# Patient Record
Sex: Male | Born: 1990 | Race: White | Hispanic: No | Marital: Single | Smoking: Never smoker
Health system: Southern US, Community
[De-identification: ages and names within clinical notes are randomized; demographics above are authoritative.]

## PROBLEM LIST (undated history)

## (undated) DIAGNOSIS — IMO0002 Reserved for concepts with insufficient information to code with codable children: Secondary | ICD-10-CM

---
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
## 2010-05-25 ENCOUNTER — Emergency Department: Payer: Self-pay | Admitting: Emergency Medicine

## 2010-05-27 ENCOUNTER — Emergency Department: Payer: Self-pay | Admitting: Internal Medicine

## 2012-03-28 ENCOUNTER — Emergency Department (HOSPITAL_COMMUNITY)
Admission: EM | Admit: 2012-03-28 | Discharge: 2012-03-28 | Disposition: A | Discharge status: Home / Self Care | Payer: Self-pay | Attending: Emergency Medicine | Admitting: Emergency Medicine

## 2012-03-28 ENCOUNTER — Encounter (HOSPITAL_COMMUNITY): Payer: Self-pay | Admitting: Emergency Medicine

## 2012-03-28 DIAGNOSIS — IMO0002 Reserved for concepts with insufficient information to code with codable children: Secondary | ICD-10-CM | POA: Insufficient documentation

## 2012-03-28 DIAGNOSIS — M545 Low back pain, unspecified: Secondary | ICD-10-CM | POA: Insufficient documentation

## 2012-03-28 DIAGNOSIS — X58XXXA Exposure to other specified factors, initial encounter: Secondary | ICD-10-CM | POA: Insufficient documentation

## 2012-03-28 DIAGNOSIS — M549 Dorsalgia, unspecified: Secondary | ICD-10-CM

## 2012-03-28 DIAGNOSIS — G8929 Other chronic pain: Secondary | ICD-10-CM | POA: Insufficient documentation

## 2012-03-28 DIAGNOSIS — T148XXA Other injury of unspecified body region, initial encounter: Secondary | ICD-10-CM | POA: Insufficient documentation

## 2012-03-28 HISTORY — DX: Reserved for concepts with insufficient information to code with codable children: IMO0002

## 2012-03-28 MED ORDER — HYDROMORPHONE HCL PF 1 MG/ML IJ SOLN
1.0000 mg | Freq: Once | INTRAMUSCULAR | Status: AC
  Administered 2012-03-28: 1 mg via INTRAMUSCULAR
  Filled 2012-03-28: qty 1

## 2012-03-28 MED ORDER — OXYCODONE-ACETAMINOPHEN 10-325 MG PO TABS: 1.0000 | ORAL_TABLET | ORAL | Status: AC | PRN

## 2012-03-28 MED ORDER — METHOCARBAMOL 500 MG PO TABS: 500.0000 mg | ORAL_TABLET | Freq: Two times a day (BID) | ORAL | Status: AC

## 2012-03-28 MED ORDER — DIAZEPAM 5 MG/ML IJ SOLN
5.0000 mg | Freq: Once | INTRAMUSCULAR | Status: AC
  Administered 2012-03-28: 5 mg via INTRAMUSCULAR
  Filled 2012-03-28: qty 2

## 2012-03-28 NOTE — Discharge Instructions (Signed)
Please followup with her primary care provider or orthopedic specialist for continued evaluation and management of your chronic low back pains.   Chronic Back Pain When back pain lasts longer than 3 months, it is called chronic back pain.This pain can be frustrating, but the cause of the pain is rarely dangerous.People with chronic back pain often go through certain periods that are more intense (flare-ups). CAUSES Chronic back pain can be caused by wear and tear (degeneration) on different structures in your back. These structures may include bones, ligaments, or discs. This degeneration may result in more pressure being placed on the nerves that travel to your legs and feet. This can lead to pain traveling from the low back down the back of the legs. When pain lasts longer than 3 months, it is not unusual for people to experience anxiety or depression. Anxiety and depression can also contribute to low back pain. TREATMENT  Establish a regular exercise plan. This is critical to improving your functional level.   Have a self-management plan for when you flare-up. Flare-ups rarely require a medical visit. Regular exercise will help reduce the intensity and frequency of your flare-ups.   Manage how you feel about your back pain and the rest of your life. Anxiety, depression, and feeling that you cannot alter your back pain have been shown to make back pain more intense and debilitating.   Medicines should never be your only treatment. They should be used along with other treatments to help you return to a more active lifestyle.   Procedures such as injections or surgery may be helpful but are rarely necessary. You may be able to get the same results with physical therapy or chiropractic care.  HOME CARE INSTRUCTIONS  Avoid bending, heavy lifting, prolonged sitting, and activities which make the problem worse.   Continue normal activity as much as possible.   Take brief periods of rest  throughout the day to reduce your pain during flare-ups.   Follow your back exercise rehabilitation program. This can help reduce symptoms and prevent more pain.   Only take over-the-counter or prescription medicines as directed by your caregiver. Muscle relaxants are sometimes prescribed. Narcotic pain medicine is discouraged for long-term pain, since addiction is a possible outcome.   If you smoke, quit.   Eat healthy foods and maintain a recommended body weight.  SEEK IMMEDIATE MEDICAL CARE IF:   You have weakness or numbness in one of your legs or feet.   You have trouble controlling your bladder or bowels.   You develop nausea, vomiting, abdominal pain, shortness of breath, or fainting.  Document Released: 12/28/2004 Document Revised: 11/09/2011 Document Reviewed: 11/04/2011 [facility] Patient Information 2012 [place], [place].    Back Exercises Back exercises help treat and prevent back injuries. The goal of back exercises is to increase the strength of your abdominal and back muscles and the flexibility of your back. These exercises should be started when you no longer have back pain. Back exercises include:  Pelvic Tilt. Lie on your back with your knees bent. Tilt your pelvis until the lower part of your back is against the floor. Hold this position 5 to 10 sec and repeat 5 to 10 times.   Knee to Chest. Pull first 1 knee up against your chest and hold for 20 to 30 seconds, repeat this with the other knee, and then both knees. This may be done with the other leg straight or bent, whichever feels better.   Sit-Ups or Curl-Ups. [employer]  your knees 90 degrees. Start with tilting your pelvis, and do a partial, slow sit-up, lifting your trunk only 30 to 45 degrees off the floor. Take at least 2 to 3 seconds for each sit-up. Do not do sit-ups with your knees out straight. If partial sit-ups are difficult, simply do the above but with only tightening your abdominal muscles and holding it as  directed.   Hip-Lift. Lie on your back with your knees flexed 90 degrees. Push down with your feet and shoulders as you raise your hips a couple inches off the floor; hold for 10 seconds, repeat 5 to 10 times.   Back arches. Lie on your stomach, propping yourself up on bent elbows. Slowly press on your hands, causing an arch in your low back. Repeat 3 to 5 times. Any initial stiffness and discomfort should lessen with repetition over time.   Shoulder-Lifts. Lie face down with arms beside your body. Keep hips and torso pressed to floor as you slowly lift your head and shoulders off the floor.  Do not overdo your exercises, especially in the beginning. Exercises may cause you some mild back discomfort which lasts for a few minutes; however, if the pain is more severe, or lasts for more than 15 minutes, do not continue exercises until you see your caregiver. Improvement with exercise therapy for back problems is slow.  See your caregivers for assistance with developing a proper back exercise program. Document Released: 12/28/2004 Document Revised: 11/09/2011 Document Reviewed: 11/20/2005 [facility] Patient Information 2012 [place], [place].     Chronic Pain Management Managing chronic pain is not easy. The goal is to provide as much pain relief as possible. There are emotional as well as physical problems. Chronic pain may lead to symptoms of depression which magnify those of the pain. Problems may include:  Anxiety.   Sleep disturbances.   Confused thinking.   Feeling cranky.   Fatigue.   Weight gain or loss.  Identify the source of the pain first, if possible. The pain may be masking another problem. Try to find a pain management specialist or clinic. Work with a team to create a treatment plan for you. MEDICATIONS  May include narcotics or opioids. Larger than normal doses may be needed to control your pain.   Drugs for depression may help.   Over-the-counter medicines may help for  some conditions. These drugs may be used along with others for better pain relief.   May be injected into sites such as the spine and joints. Injections may have to be repeated if they wear off.  THERAPY MAY INCLUDE:  Working with a physical therapist to keep from getting stiff.   Regular, gentle exercise.   Cognitive or behavioral therapy.   Using complementary or integrative medicine such as:   Acupuncture.   Massage, Reiki, or Rolfing.   Aroma, color, light, or sound therapy.   Group support.  FOR MORE INFORMATION [email or web address]. American Chronic Pain Association [email or web address]. Document Released: 12/28/2004 Document Revised: 11/09/2011 Document Reviewed: 02/06/2008 [facility] Patient Information 2012 [place], [place].   RESOURCE GUIDE  Dental Problems  Patients with Medicaid: [facility] [phone] [address]  [address] Phone:  [phone]                                                  Phone:  [phone]  If unable to pay or uninsured, contact:  [facility] or [facility]. to become qualified for the adult dental clinic.  Chronic Pain Problems Contact [facility]  [phone] Patients need to be referred by their primary care doctor.  Insufficient Money for Medicine Contact [employer] Way:  call "211" or [facility] [phone].  No Primary Care Doctor Call [facility]  [phone] Other agencies that provide inexpensive medical care    [facility]  9187639527    [facility]  [phone]    [facility]  [phone]    [facility]  [phone]    [facility]  [phone]    [facility]  [phone]  Psychological Services [facility] Behavioral Health  [phone] [facility]  [phone] [facility] Mental Health   [phone] (emergency services  ([phone])  Substance Abuse Resources [facility]  (416)576-4390 [facility] (682) 090-8524 [facility] 404-078-4159 Floydene Flock (910)277-7024 Residential & Outpatient Substance Abuse Program  (816)312-6610  Abuse/Neglect [facility] Child Abuse Hotline (506) 571-9898 [facility] Child Abuse Hotline (902) 566-5381 (After Hours)  Emergency Shelter [facility] 314-683-3519  Maternity Homes [facility] [phone] [facility] [phone]  MRSA Hotline #:   (734)082-3383    [facility] Resources  [facility]     [employer]                          [facility] Dept. [address]                        [address]      [address]  Blondell Reveal Phone:  [phone]                                   Phone:  [phone]                 Phone:  [phone]  [facility] Mental [facility] Phone:  [phone]  [facility] Child Abuse Hotline (307)799-5733 [phone] (After Hours)

## 2012-03-28 NOTE — ED Notes (Addendum)
Pt has chronic back pain; was working today and had difficulty standing back up. Dx degenerative disc disease. PT reports he was going to pain clinic thru [facility]. He no longer sees them due to fact that medicine they were prescribing were not helping.

## 2012-03-28 NOTE — ED Provider Notes (Signed)
History     CSN: [id number]  Arrival date & time 03/28/12  2201   First MD Initiated Contact with Patient 03/28/12 2217      Chief Complaint  Patient presents with  . Back Pain    HPI  History provided by the patient. Patient is a 21 year old male with reported history of degenerative disc disease and chronic back pains who presents with increased low back pain today. Patient states that he has had been low back pain discomfort for quite some time. Patient has been seen by trying orthopedics in [place] in the past. He states that he had MRI study showing decreased discs with some bulging. Since that time patient has not been following up regularly and is not on any medications. 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 Patient states that as he was finishing he's had sudden sharp pains in low back is worse than his typical daily pains he is worse with any movement and walking. Pain is slightly better with rest. Patient denies any other aggravating or alleviating factors. Pain does not radiate. Patient denies any urinary or fecal incontinence, urinary retention perineal numbness. Patient denies any weakness in lower legs. Patient has been ambulatory.    Past Medical History  Diagnosis Date  . Disc degeneration     No past surgical history on file.  No family history on file.  History  Substance Use Topics  . Smoking status: Not on file  . Smokeless tobacco: Not on file  . Alcohol Use:       Review of Systems  Constitutional: Negative for fever and chills.  Gastrointestinal: Negative for abdominal pain.  Genitourinary: Negative for dysuria, frequency, hematuria and flank pain.  Neurological: Negative for weakness and numbness.    Allergies  Review of patient's allergies indicates no known allergies.  Home Medications  No current outpatient prescriptions on file.  BP 148/86  Pulse 114  Temp(Src) 98.6 F (37 C) (Oral)  Resp 18  SpO2 100%  Physical  Exam  Nursing note and vitals reviewed. Constitutional: He is oriented to person, place, and time. He appears well-developed and well-nourished. No distress.  HENT:  Head: Normocephalic and atraumatic.  Cardiovascular: Normal rate and regular rhythm.   Pulmonary/Chest: Effort normal and breath sounds normal. No respiratory distress. He has no wheezes. He has no rales.  Abdominal: Soft. There is no tenderness.       No CVA tenderness  Musculoskeletal:       Cervical back: Normal.       Thoracic back: Normal.       Lumbar back: He exhibits tenderness.       Back:  Neurological: He is alert and oriented to person, place, and time. He has normal strength. No sensory deficit. Gait normal.  Skin: Skin is warm. No rash noted.  Psychiatric: He has a normal mood and affect. His behavior is normal.    ED Course  Procedures    1. Chronic back pain   2. Muscle strain       MDM  Patient seen and evaluated. Patient no acute distress. Patient with no replaced or back pain. No new injuries or trauma. Pain exacerbated by weed eating       Angus Seller, [place] 03/29/12 [phone]

## 2012-03-29 NOTE — ED Provider Notes (Signed)
Medical screening examination/treatment/procedure(s) were performed by non-physician practitioner and as supervising physician I was immediately available for consultation/collaboration.   Glynn Octave, MD 03/29/12 1021

## 2012-08-15 ENCOUNTER — Ambulatory Visit: Payer: Self-pay | Admitting: Family Medicine

## 2013-06-05 ENCOUNTER — Ambulatory Visit: Payer: Self-pay | Admitting: Family Medicine

## 2013-06-05 LAB — CBC WITH DIFFERENTIAL/PLATELET
Basophil %: 0.5 %
Eosinophil %: 2.9 %
Lymphocyte %: 19.3 %
MCHC: 34.1 g/dL (ref 32.0–36.0)
Monocyte #: 0.8 x10 3/mm (ref 0.2–1.0)
Neutrophil #: 5.1 10*3/uL (ref 1.4–6.5)
Neutrophil %: 67.4 %
RBC: 4.96 10*6/uL (ref 4.40–5.90)
RDW: 13.3 % (ref 11.5–14.5)
WBC: 7.6 10*3/uL (ref 3.8–10.6)

## 2013-10-13 ENCOUNTER — Ambulatory Visit: Payer: Self-pay | Admitting: Family Medicine

## 2013-10-15 ENCOUNTER — Emergency Department: Payer: Self-pay | Admitting: Emergency Medicine

## 2013-10-15 LAB — BASIC METABOLIC PANEL
Anion Gap: 3 — ABNORMAL LOW (ref 7–16)
BUN: 6 mg/dL — ABNORMAL LOW (ref 7–18)
Calcium, Total: 9.1 mg/dL (ref 8.5–10.1)
Chloride: 105 mmol/L (ref 98–107)
Co2: 29 mmol/L (ref 21–32)
EGFR (African American): >60
EGFR (Non-African Amer.): >60
Potassium: 4.4 mmol/L (ref 3.5–5.1)
Sodium: 137 mmol/L (ref 136–145)

## 2013-10-15 LAB — CBC
HGB: 16.2 g/dL (ref 13.0–18.0)
MCHC: 34.9 g/dL (ref 32.0–36.0)
MCV: 92 fL (ref 80–100)
RDW: 13.7 % (ref 11.5–14.5)

## 2014-04-21 IMAGING — CT CT HEAD WITHOUT AND WITH CONTRAST
1 of 2 series · 13 of 30 positions shown, 17 images · non-contrast
Comparison: none

REASON FOR EXAM: Call Report [id number] extreme headache  vomiting
COMMENTS:

PROCEDURE:     RONLOR - BLAIN JUMPER/PUSHPA  - August 15, 2012  [DATE]
RESULT:     Comparison:  None
INDICATION: Headache and vomiting.
TECHNIQUE: Multiple axial images were obtained prior to following 50 mL of
Zsovue-NPP IV contrast.

[Series 2: soft tissue wo · axial · 0.41mm/px · z∈[+14,+149]mm · 13 of 33 slices shown, 17 images]
[im 3/33  brain]
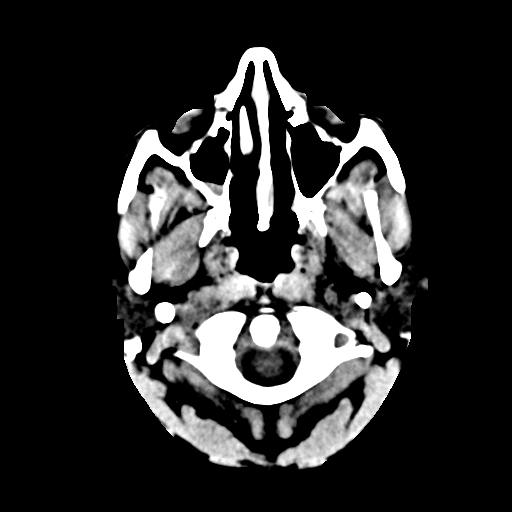
[im 3/33  bone]
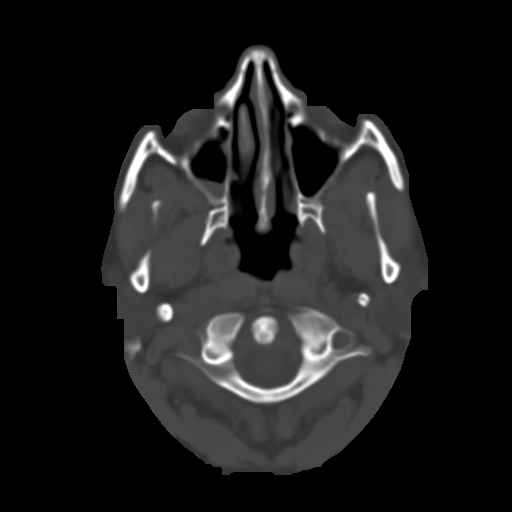
[im 5/33  brain]
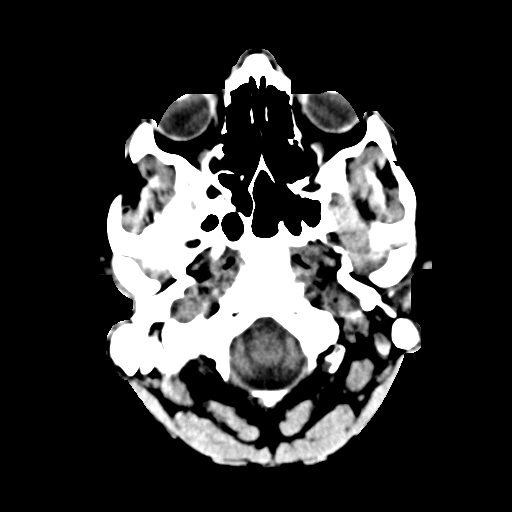
[im 7/33  brain]
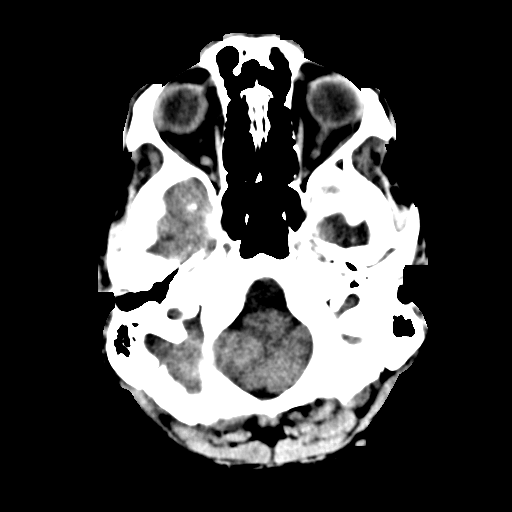
[im 10/33  brain]
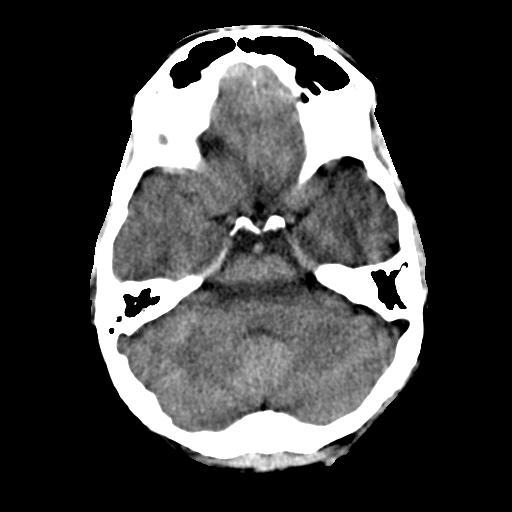
[im 12/33  brain]
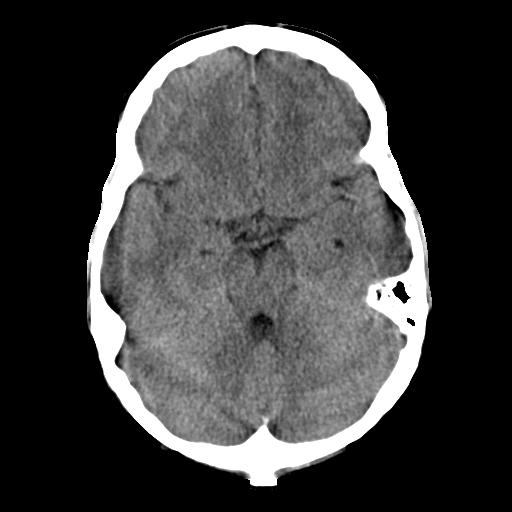
[im 12/33  bone]
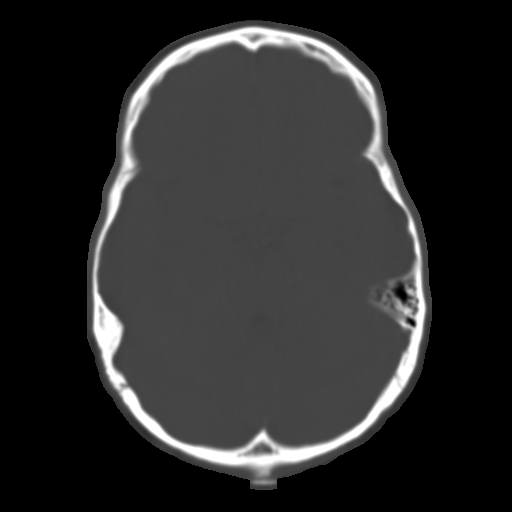
[im 14/33  brain]
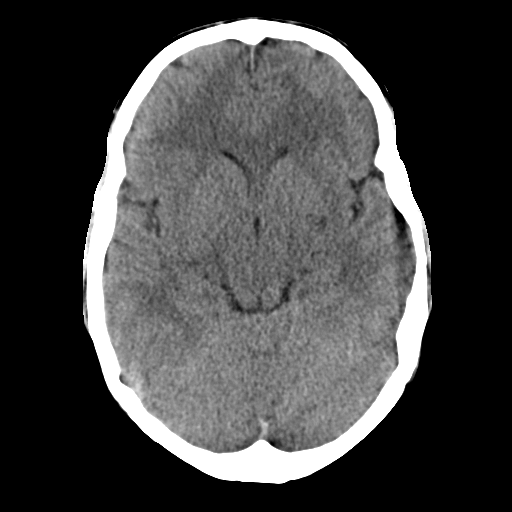
[im 17/33  brain]
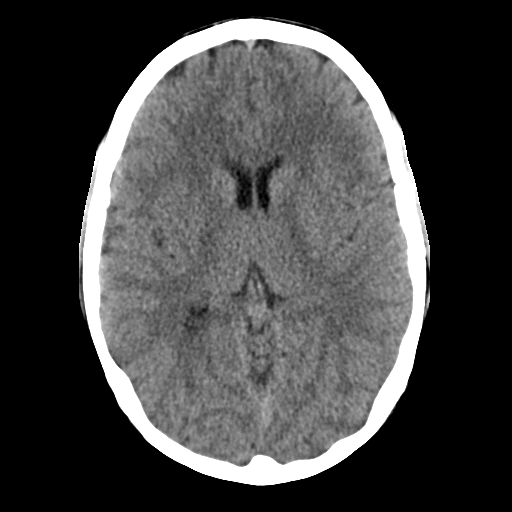
[im 19/33  brain]
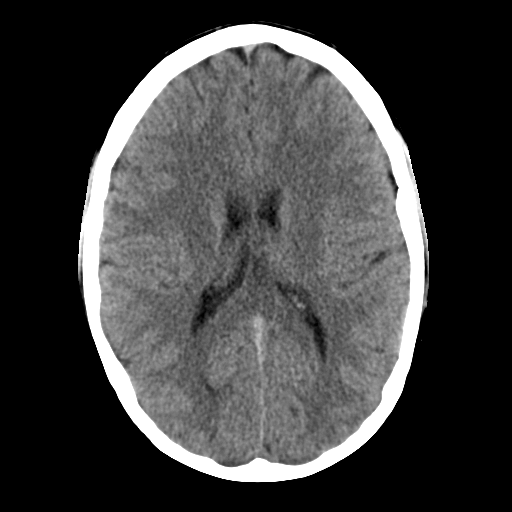
[im 21/33  brain]
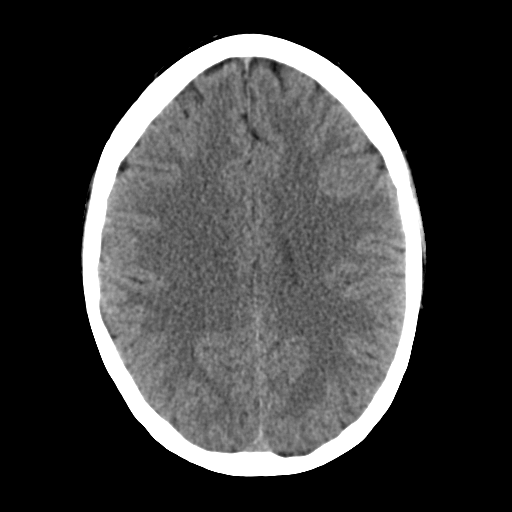
[im 21/33  bone]
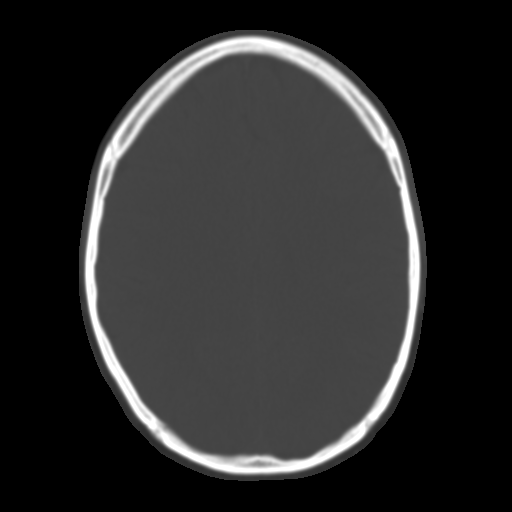
[im 23/33  brain]
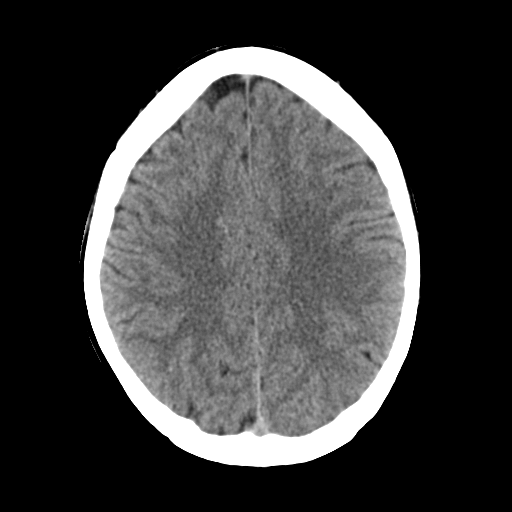
[im 26/33  brain]
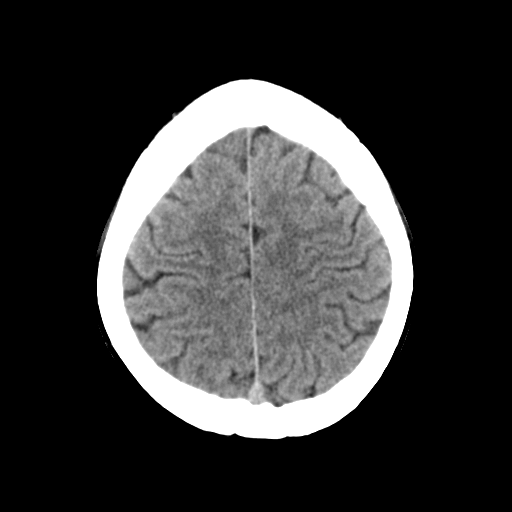
[im 28/33  brain]
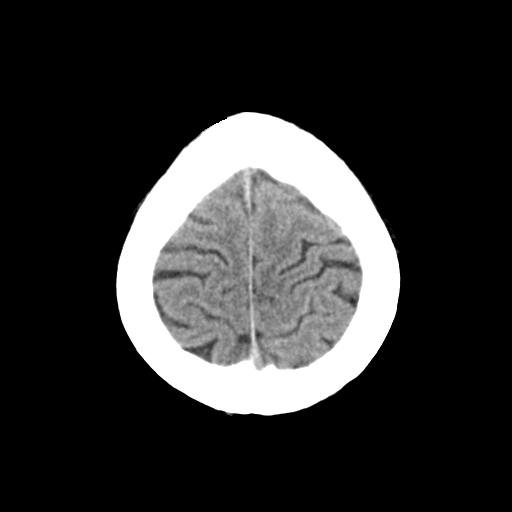
[im 30/33  brain]
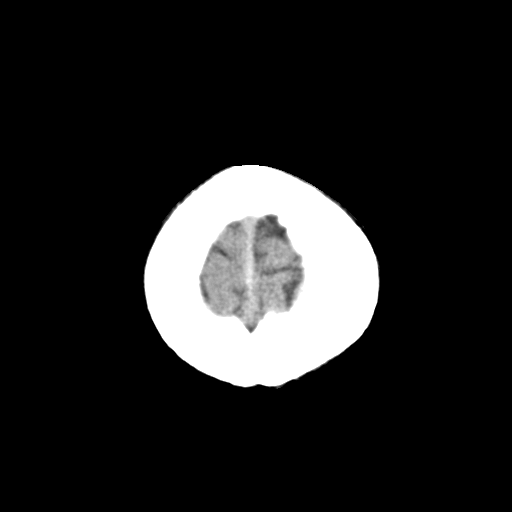
[im 30/33  bone]
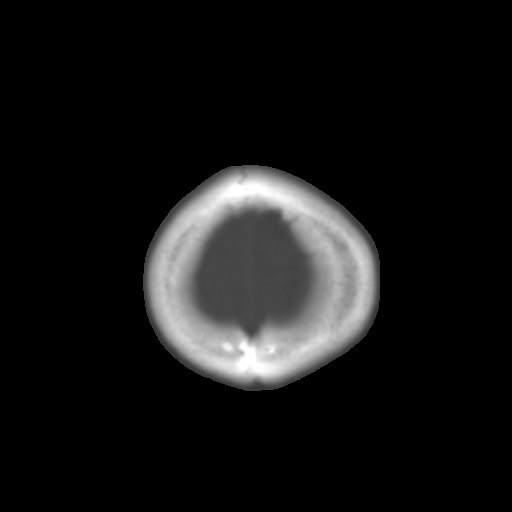

[13 of 30 positions shown; findings below may reference images not displayed]

FINDINGS: There is no abnormal parenchymal, leptomeningeal, or subependymal
enhancement. The gray-white interface is maintained. There is no evidence
for mass effect, midline shift, or extra-axial fluid collections.  The basal
cisterns are patent. There is no evidence for cortical-based area of
infarction.  Ventricles and sulci are appropriate for the patient's age.

Visualized portions of the orbits and paranasal sinuses are unremarkable.
Osseous structures are negative for fracture, lytic, or blastic lesions.
IMPRESSION: 1. No acute intracranial process.

2. No abnormal areas of enhancement.

[REDACTED]

## 2014-09-25 ENCOUNTER — Emergency Department: Payer: Self-pay | Admitting: Emergency Medicine

## 2015-06-19 IMAGING — CR DG CHEST 2V
1 series · 2 of 2 positions shown · non-contrast
Comparison: None.

CLINICAL DATA: Dry cough and congestion. Fever for 2 days.

EXAM:
CHEST  2 VIEW

[Series 1: pa · 0.17mm/px · 2 of 2 slices shown]
[im 1/2]
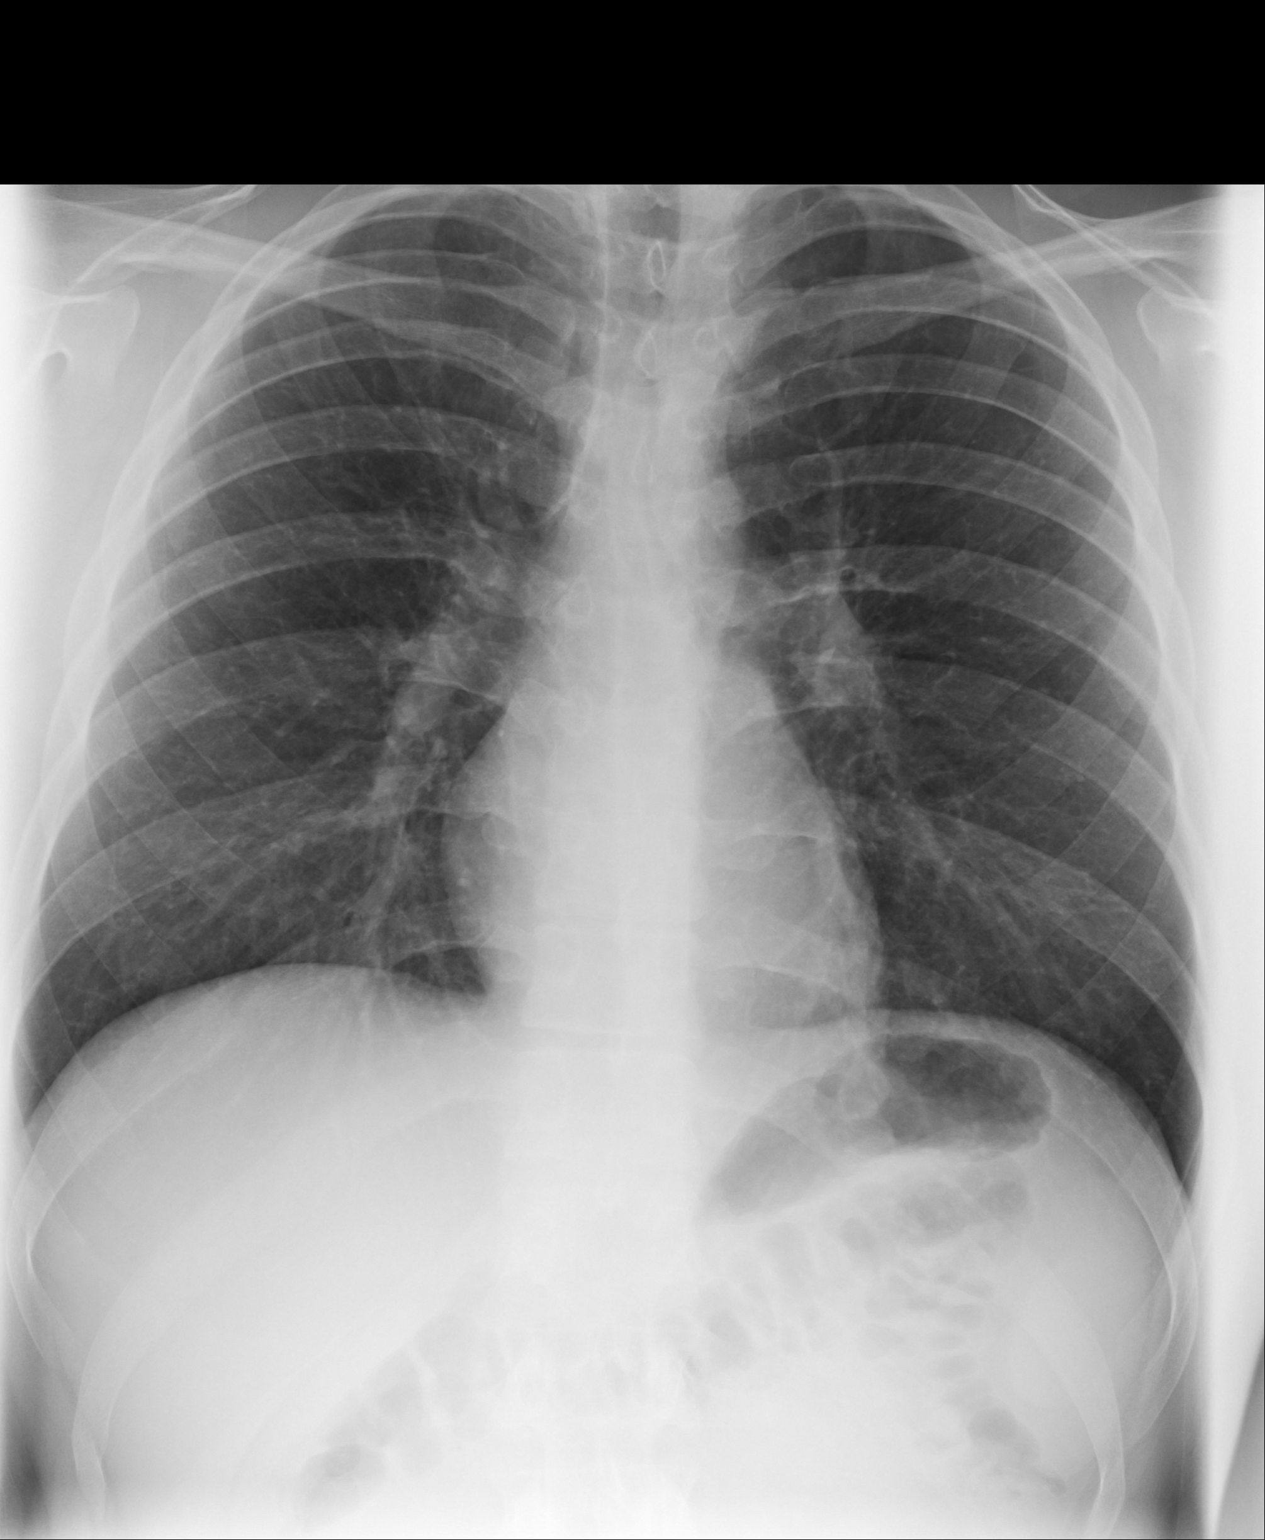
[im 2/2]
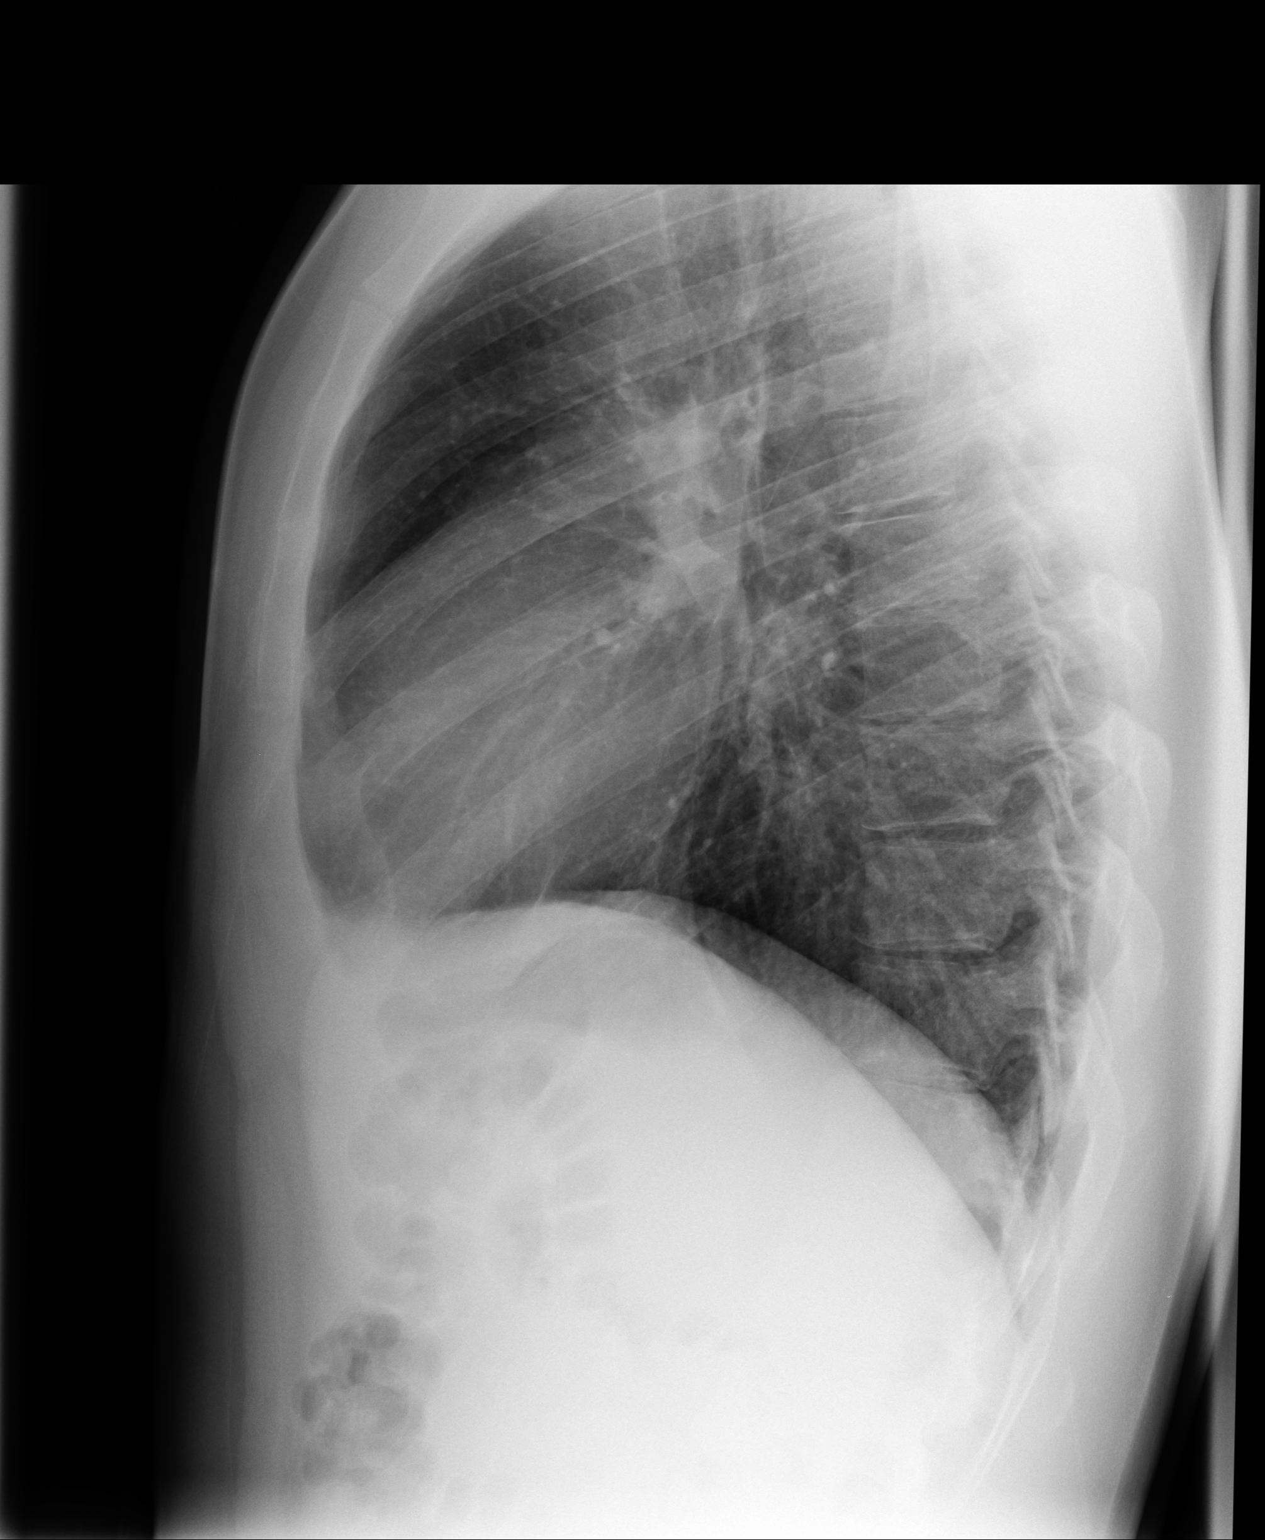

[2 of 2 positions shown; findings below may reference images not displayed]

FINDINGS: A mild pectus excavatum deformity. Midline trachea. Normal heart
size and mediastinal contours. No pleural effusion or pneumothorax.
Clear lungs.
IMPRESSION: No acute cardiopulmonary disease.

## 2015-06-21 IMAGING — CR DG CHEST 2V
1 series · 2 of 2 positions shown · non-contrast
Comparison: 10/13/2013.

CLINICAL DATA: Shortness of breath.  Smoker.  Possible pneumonia.

EXAM:
CHEST  2 VIEW

[Series 1: w chest pa · 0.14mm/px · 2 of 2 slices shown]
[im 1/2]
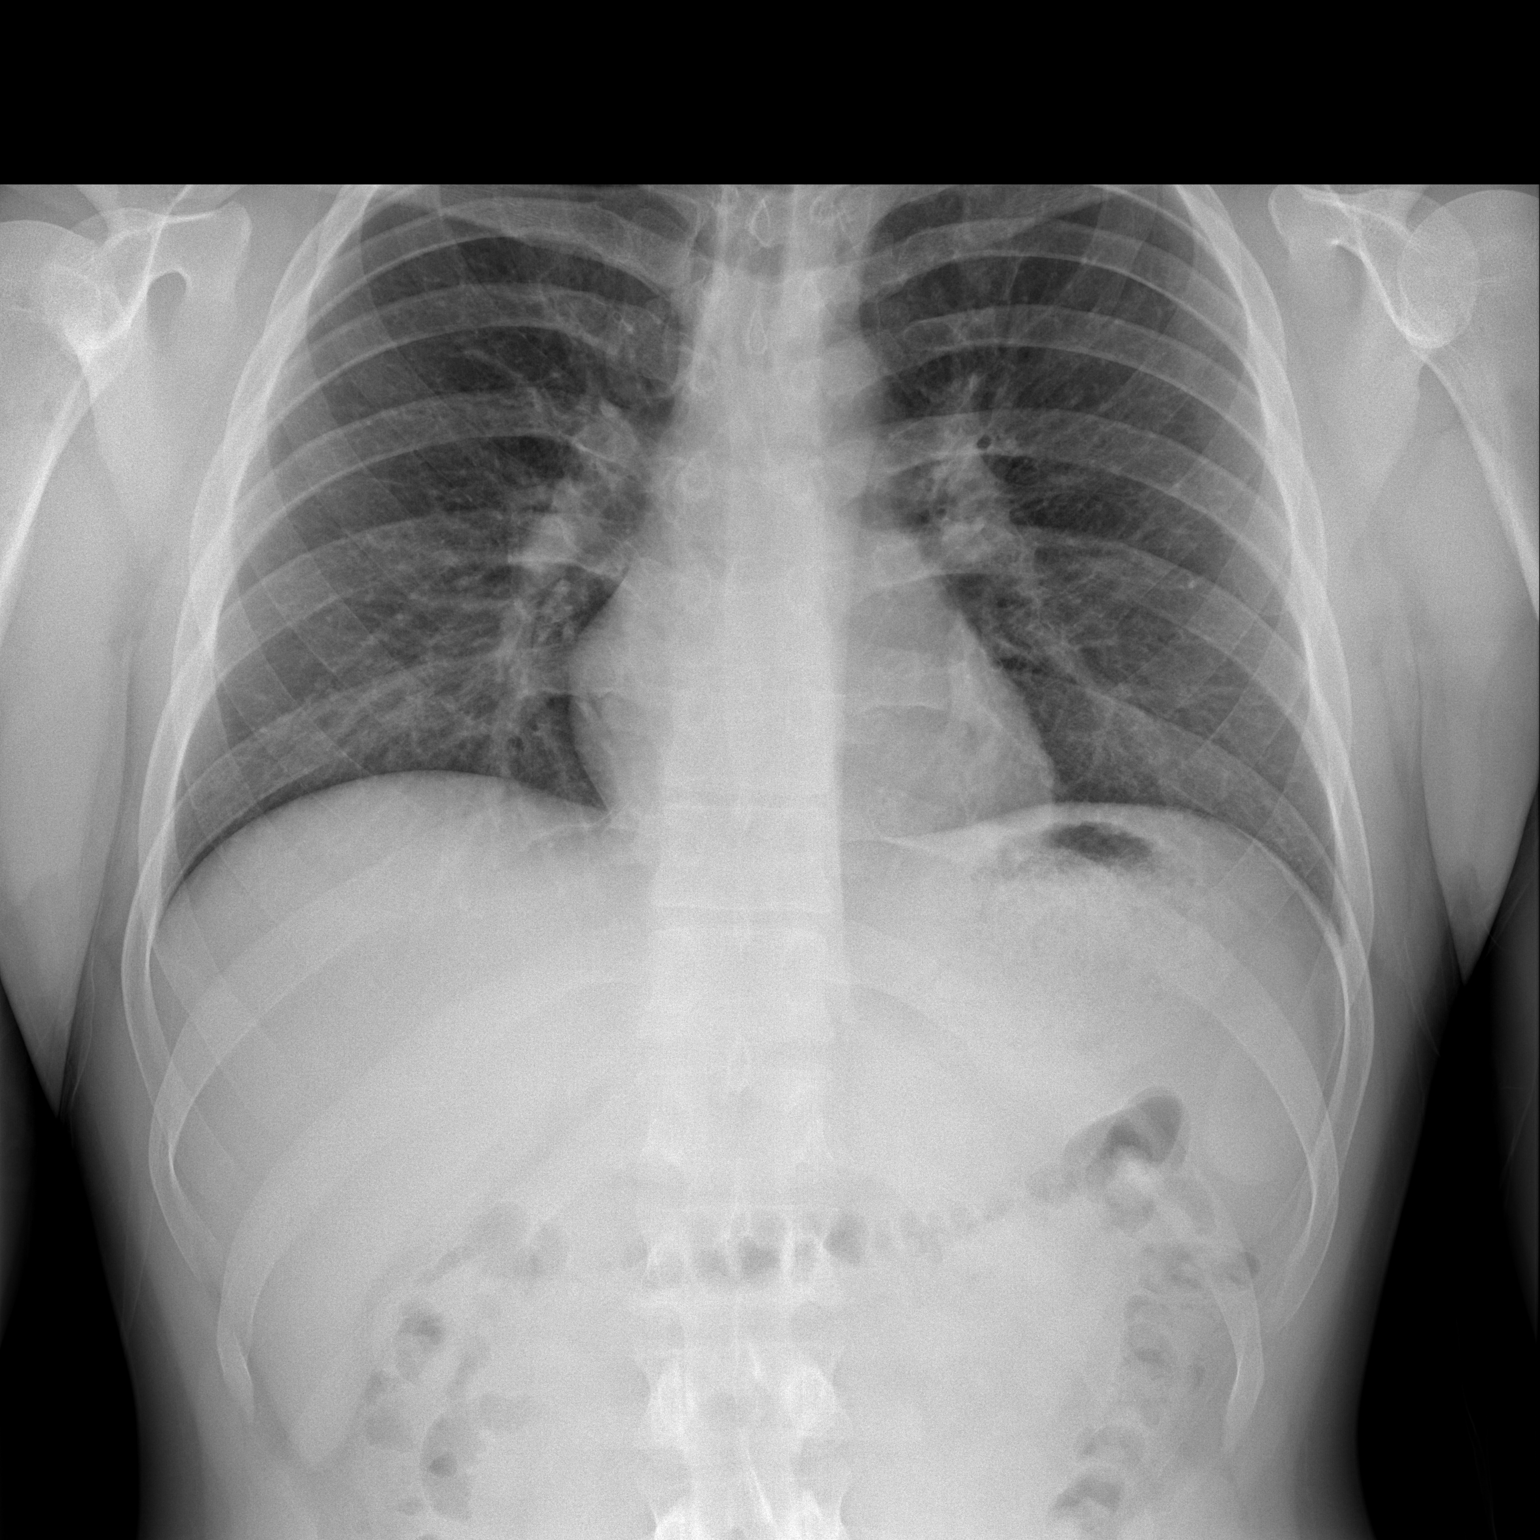
[im 2/2]
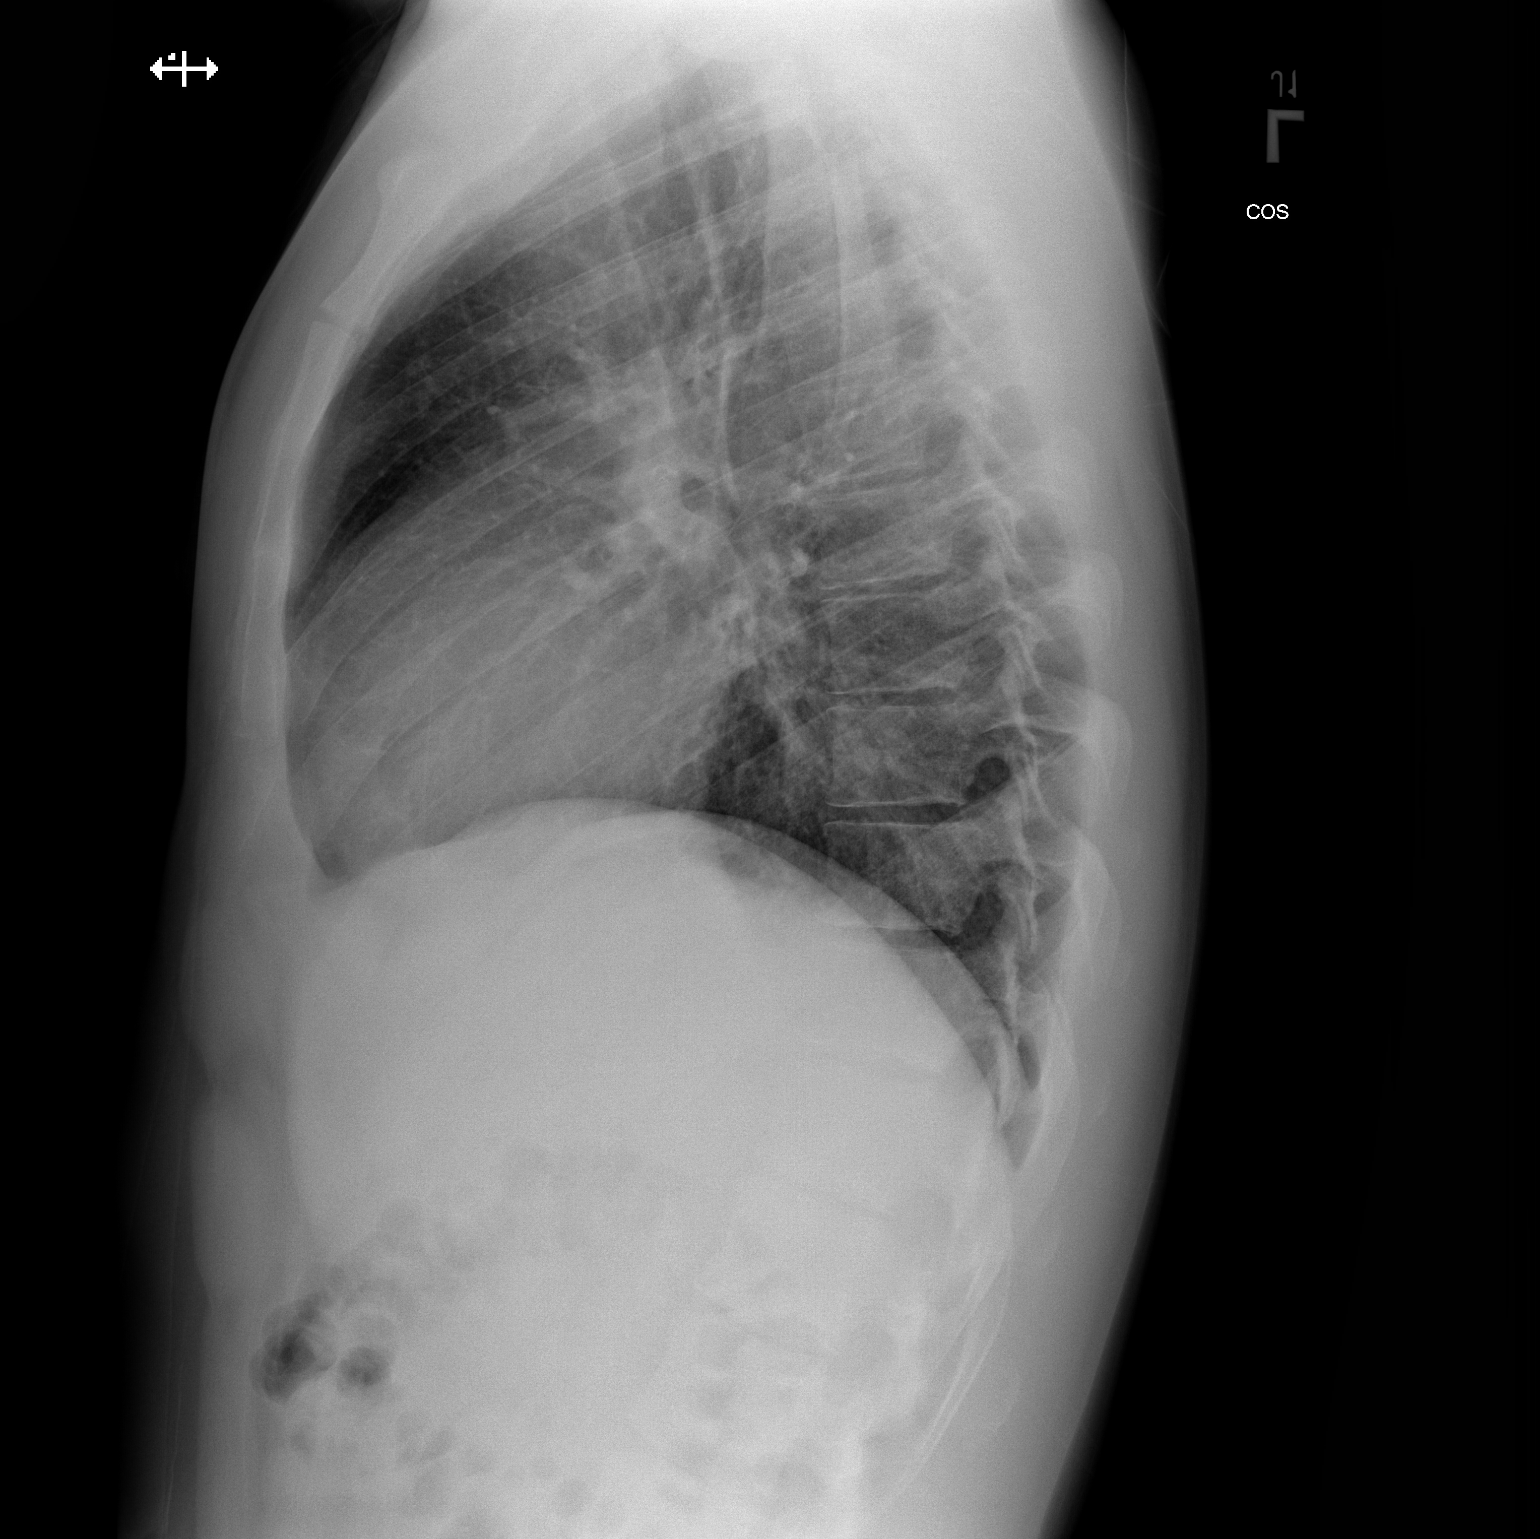

[2 of 2 positions shown; findings below may reference images not displayed]

FINDINGS: Minimal peribronchial thickening may be normal. Minimal bronchitis
type changes not excluded however, no segmental consolidation.

No pulmonary edema or pneumothorax.

Heart size within normal limits.
IMPRESSION: Minimal peribronchial thickening.  Please see above.

## 2017-08-03 ENCOUNTER — Ambulatory Visit
Admission: EM | Admit: 2017-08-03 | Discharge: 2017-08-03 | Disposition: A | Discharge status: Home / Self Care | Payer: Self-pay | Attending: Family Medicine | Admitting: Family Medicine

## 2017-08-03 DIAGNOSIS — S83241D Other tear of medial meniscus, current injury, right knee, subsequent encounter: Secondary | ICD-10-CM

## 2017-08-03 DIAGNOSIS — M25561 Pain in right knee: Secondary | ICD-10-CM

## 2017-08-03 MED ORDER — HYDROCODONE-ACETAMINOPHEN 5-325 MG PO TABS: ORAL_TABLET | ORAL | 0 refills | Status: AC

## 2017-08-03 NOTE — Discharge Instructions (Signed)
Follow up with orthopedist as recommended

## 2017-08-03 NOTE — ED Triage Notes (Signed)
Pt had MRI last week and dx with torn right meniscus. Today at work her squatted down and felt pop in right knee and is now having increased pain

## 2017-09-10 ENCOUNTER — Other Ambulatory Visit: Payer: Self-pay

## 2017-09-26 NOTE — ED Provider Notes (Signed)
[facility]    CSN: [id number] Arrival date & time: 08/03/17  1619     History   Chief Complaint Chief Complaint  Patient presents with  . Knee Pain    HPI KEJON FEILD is a 26 y.o. male.   26 yo male with a c/o right knee pain and diagnosis of torn meniscus on MR done last week. Patient states he reagravated his knee condition when he squatted at work today, with increase in pain.    The history is provided by the patient.  Knee Pain    Past Medical History:  Diagnosis Date  . Disc degeneration     There are no active problems to display for this patient.   History reviewed. No pertinent surgical history.     Home Medications    Prior to Admission medications   Medication Sig Start Date End Date Taking? Authorizing Provider  HYDROcodone-acetaminophen (NORCO/VICODIN) 5-325 MG tablet 1-2 tabs po q 8 hours prn 08/03/17   Payton Mccallum, MD    Family History No family history on file.  Social History Social History  Substance Use Topics  . Smoking status: Never Smoker  . Smokeless tobacco: Never Used  . Alcohol use No     Allergies   Patient has no known allergies.   Review of Systems Review of Systems   Physical Exam Triage Vital Signs ED Triage Vitals  Enc Vitals Group     BP 08/03/17 1643 (!) 152/81     Pulse Rate 08/03/17 1643 99     Resp 08/03/17 1643 18     Temp 08/03/17 1643 98.1 F (36.7 C)     Temp Source 08/03/17 1643 Oral     SpO2 08/03/17 1643 100 %     Weight 08/03/17 1644 195 lb (88.5 kg)     Height 08/03/17 1644 6\' 1"  (1.854 m)     Head Circumference --      Peak Flow --      Pain Score 08/03/17 1644 10     Pain Loc --      Pain Edu? --      Excl. in GC? --    No data found.   Updated Vital Signs BP (!) 152/81 (BP Location: Left Arm)   Pulse 99   Temp 98.1 F (36.7 C) (Oral)   Resp 18   Ht 6\' 1"  (1.854 m)   Wt 195 lb (88.5 kg)   SpO2 100%   BMI 25.73 kg/m   Visual Acuity Right Eye  Distance:   Left Eye Distance:   Bilateral Distance:    Right Eye Near:   Left Eye Near:    Bilateral Near:     Physical Exam  Constitutional: He appears well-developed and well-nourished. No distress.  Musculoskeletal:       Right knee: He exhibits swelling (mild). He exhibits normal range of motion, no effusion, no ecchymosis, no deformity, no laceration, no erythema, normal alignment, no LCL laxity, normal patellar mobility and no MCL laxity. Tenderness found. Medial joint line and lateral joint line tenderness noted.  Skin: He is not diaphoretic.  Vitals reviewed.    UC Treatments / Results  Labs (all labs ordered are listed, but only abnormal results are displayed) Labs Reviewed - No data to display  EKG  EKG Interpretation None       Radiology No results found.  Procedures Procedures (including critical care time)  Medications Ordered in UC Medications - No data to display  Initial Impression / Assessment and Plan / UC Course  I have reviewed the triage vital signs and the nursing notes.  Pertinent labs & imaging results that were available during my care of the patient were reviewed by me and considered in my medical decision making (see chart for details).       Final Clinical Impressions(s) / UC Diagnoses   Final diagnoses:  Acute pain of right knee  Other tear of medial meniscus, current injury, right knee, subsequent encounter    New Prescriptions Discharge Medication List as of 08/03/2017  5:49 PM    START taking these medications   Details  HYDROcodone-acetaminophen (NORCO/VICODIN) 5-325 MG tablet 1-2 tabs po q 8 hours prn, Print       1. diagnosis reviewed with patient 2. rx as per orders above; reviewed possible side effects, interactions, risks and benefits  3. Recommend supportive treatment with otc analgesics prn, ice 4. Follow-up prn if symptoms worsen or don't improve  Controlled Substance Prescriptions [place] Controlled Substance  Registry consulted? Yes, I have consulted the [place] Controlled Substances Registry for this patient, and feel the risk/benefit ratio today is favorable for proceeding with this prescription for a controlled substance.   Payton Mccallumonty, Alexia Dinger, MD 09/26/17 [phone]

## 2018-01-16 ENCOUNTER — Encounter: Payer: Self-pay | Admitting: Emergency Medicine

## 2018-01-16 ENCOUNTER — Emergency Department
Admission: EM | Admit: 2018-01-16 | Discharge: 2018-01-16 | Disposition: A | Discharge status: Home / Self Care | Payer: Self-pay | Attending: Emergency Medicine | Admitting: Emergency Medicine

## 2018-01-16 ENCOUNTER — Other Ambulatory Visit: Payer: Self-pay

## 2018-01-16 DIAGNOSIS — Z189 Retained foreign body fragments, unspecified material: Secondary | ICD-10-CM | POA: Insufficient documentation

## 2018-01-16 DIAGNOSIS — X58XXXA Exposure to other specified factors, initial encounter: Secondary | ICD-10-CM | POA: Insufficient documentation

## 2018-01-16 DIAGNOSIS — Y9389 Activity, other specified: Secondary | ICD-10-CM | POA: Insufficient documentation

## 2018-01-16 DIAGNOSIS — H44702 Unspecified retained (old) intraocular foreign body, nonmagnetic, left eye: Secondary | ICD-10-CM | POA: Insufficient documentation

## 2018-01-16 DIAGNOSIS — Y929 Unspecified place or not applicable: Secondary | ICD-10-CM | POA: Insufficient documentation

## 2018-01-16 DIAGNOSIS — Y999 Unspecified external cause status: Secondary | ICD-10-CM | POA: Insufficient documentation

## 2018-01-16 MED ORDER — ERYTHROMYCIN 5 MG/GM OP OINT
1.0000 "application " | TOPICAL_OINTMENT | Freq: Once | OPHTHALMIC | Status: AC
  Administered 2018-01-16: 1 via OPHTHALMIC
  Filled 2018-01-16: qty 1

## 2018-01-16 MED ORDER — FLUORESCEIN SODIUM 1 MG OP STRP
1.0000 | ORAL_STRIP | Freq: Once | OPHTHALMIC | Status: AC
  Administered 2018-01-16: 1 via OPHTHALMIC
  Filled 2018-01-16: qty 1

## 2018-01-16 MED ORDER — TETRACAINE HCL 0.5 % OP SOLN
2.0000 [drp] | Freq: Once | OPHTHALMIC | Status: AC
  Administered 2018-01-16: 2 [drp] via OPHTHALMIC
  Filled 2018-01-16: qty 4

## 2018-01-16 MED ORDER — ERYTHROMYCIN 5 MG/GM OP OINT: 1.0000 "application " | TOPICAL_OINTMENT | Freq: Four times a day (QID) | OPHTHALMIC | 0 refills | Status: AC

## 2018-01-16 NOTE — ED Provider Notes (Signed)
[facility] Emergency Department Provider Note ____________________________________________  Time seen: 2004  I have reviewed the triage vital signs and the nursing notes.  HISTORY  Chief Complaint  Foreign Body in Eye  HPI Terry Martin is a 27 y.o. male sent to the ED accompanied by his fianc, for evaluation of left eye foreign body sensation.  Patient describes using a Dremel tool to and a rested knife.  Describes a sudden foreign body sensation to the left eye.  Since that time he had increased eye irritation, redness, tearing, and light sensitivity.  He denies any nausea, vomiting, or dizziness.  Patient admits to attempting to lift the foreign body off of his eye using a high-powered magnet.  Past Medical History:  Diagnosis Date  . Disc degeneration     There are no active problems to display for this patient.  History reviewed. No pertinent surgical history.  Prior to Admission medications   Medication Sig Start Date End Date Taking? Authorizing Provider  erythromycin ophthalmic ointment Place 1 application into the left eye 4 (four) times daily for 7 days. 01/16/18 01/23/18  Brennley Curtice, Charlesetta Ivory, PA-C  HYDROcodone-acetaminophen (NORCO/VICODIN) 5-325 MG tablet 1-2 tabs po q 8 hours prn 08/03/17   Payton Mccallum, MD    Allergies Patient has no known allergies.  No family history on file.  Social History Social History   Tobacco Use  . Smoking status: Never Smoker  . Smokeless tobacco: Never Used  Substance Use Topics  . Alcohol use: No  . Drug use: No    Review of Systems  Constitutional: Negative for fever. Eyes: Negative for visual changes. Left eye FBS.  ENT: Negative for sore throat. Respiratory: Negative for shortness of breath. Gastrointestinal: Negative for abdominal pain, vomiting and diarrhea. Neurological: Negative for headaches, focal weakness or numbness. ____________________________________________  PHYSICAL  EXAM:  VITAL SIGNS: ED Triage Vitals  Enc Vitals Group     BP 01/16/18 2055 (!) 123/45     Pulse Rate 01/16/18 2055 82     Resp 01/16/18 2055 20     Temp 01/16/18 2055 97.9 F (36.6 C)     Temp Source 01/16/18 2055 Oral     SpO2 01/16/18 2055 100 %     Weight 01/16/18 2106 185 lb (83.9 kg)     Height 01/16/18 2106 6\' 1"  (1.854 m)     Head Circumference --      Peak Flow --      Pain Score 01/16/18 2105 10     Pain Loc --      Pain Edu? --      Excl. in GC? --     Constitutional: Alert and oriented. Well appearing and in no distress. Head: Normocephalic and atraumatic. Eyes: Conjunctivae are normal. PERRL. Normal extraocular movements. Left eye with injected conjunctiva. Retained metallic foreign body to the left eye at the 9 o'clock position on the cornea. Local rust ring noted.  Cardiovascular: Normal rate, regular rhythm. Normal distal pulses. Respiratory: Normal respiratory effort. No wheezes/rales/rhonchi. Neurologic:  Normal speech and language. No gross focal neurologic deficits are appreciated. ____________________________________________  PROCEDURES  Procedures   Visual Acuity  Right Eye Distance: 20/25 Left Eye Distance: 20/40   Erythromycin ointment OS ____________________________________________  INITIAL IMPRESSION / ASSESSMENT AND PLAN / ED COURSE  ----------------------------------------- 10:22 PM on 01/16/2018 ----------------------------------------- Spoke with Dr. Yolonda Kida: He will see the patient in the office first thing in the morning for FB removal.   Patient will  be discharged with a starter tube of erythromycin ointment to use every 6 hours until his appointment in the morning.  He will follow-up with ophthalmology as discussed. ____________________________________________  FINAL CLINICAL IMPRESSION(S) / ED DIAGNOSES  Final diagnoses:  Retained foreign body in eye, left      Jalyn Dutta, Charlesetta IvoryJenise V Bacon, PA-C 01/16/18 2241    Minna AntisPaduchowski,  Kevin, MD 01/16/18 [phone]

## 2018-01-16 NOTE — ED Notes (Signed)
Pt states metal in left eye since 8pm last night   Pain worse today . Swelling to left eyelid.

## 2018-01-16 NOTE — ED Notes (Signed)
Patient denies wearing glasses or contacts.

## 2018-01-16 NOTE — Discharge Instructions (Signed)
You have a retained foreign body to the eye. It is very important that you see Dr. Brooke DareKing (ophthalmology) tomorrow to have it removed in the office. You may use the antibiotic ointment as directed.

## 2018-01-16 NOTE — ED Triage Notes (Signed)
Patient states he felt like he scratched eye yesterday, noticed piece of metal in it today "piece of steel" after using dremmel took yesterday.
